# Patient Record
Sex: Male | Born: 2000 | Hispanic: No | Marital: Single | Smoking: Never smoker
Health system: Southern US, Community
[De-identification: ages and names within clinical notes are randomized; demographics above are authoritative.]

---
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
## 2019-02-10 ENCOUNTER — Emergency Department (HOSPITAL_COMMUNITY)
Admission: EM | Admit: 2019-02-10 | Discharge: 2019-02-10 | Disposition: A | Discharge status: Home / Self Care | Payer: Self-pay | Attending: Emergency Medicine | Admitting: Emergency Medicine

## 2019-02-10 ENCOUNTER — Other Ambulatory Visit: Payer: Self-pay

## 2019-02-10 DIAGNOSIS — R1013 Epigastric pain: Secondary | ICD-10-CM | POA: Insufficient documentation

## 2019-02-10 DIAGNOSIS — R112 Nausea with vomiting, unspecified: Secondary | ICD-10-CM | POA: Insufficient documentation

## 2019-02-10 DIAGNOSIS — R6883 Chills (without fever): Secondary | ICD-10-CM | POA: Insufficient documentation

## 2019-02-10 LAB — URINALYSIS, ROUTINE W REFLEX MICROSCOPIC
Bilirubin Urine: NEGATIVE
Glucose, UA: NEGATIVE mg/dL
Hgb urine dipstick: NEGATIVE
Ketones, ur: NEGATIVE mg/dL
Leukocytes,Ua: NEGATIVE
Nitrite: NEGATIVE
Protein, ur: NEGATIVE mg/dL
Specific Gravity, Urine: 1.029 (ref 1.005–1.030)
pH: 5 (ref 5.0–8.0)

## 2019-02-10 LAB — COMPREHENSIVE METABOLIC PANEL
ALT: 44 U/L (ref 0–44)
AST: 67 U/L — ABNORMAL HIGH (ref 15–41)
Albumin: 4.7 g/dL (ref 3.5–5.0)
Alkaline Phosphatase: 46 U/L (ref 38–126)
Anion gap: 11 (ref 5–15)
BUN: 14 mg/dL (ref 6–20)
CO2: 26 mmol/L (ref 22–32)
Calcium: 9.3 mg/dL (ref 8.9–10.3)
Chloride: 102 mmol/L (ref 98–111)
Creatinine, Ser: 1.07 mg/dL (ref 0.61–1.24)
GFR calc Af Amer: >60 mL/min (ref >60)
GFR calc non Af Amer: >60 mL/min (ref >60)
Glucose, Bld: 130 mg/dL — ABNORMAL HIGH (ref 70–99)
Potassium: 3.9 mmol/L (ref 3.5–5.1)
Sodium: 139 mmol/L (ref 135–145)
Total Bilirubin: 1.4 mg/dL — ABNORMAL HIGH (ref 0.3–1.2)
Total Protein: 7.3 g/dL (ref 6.5–8.1)

## 2019-02-10 LAB — CBC
HCT: 47.9 % (ref 39.0–52.0)
Hemoglobin: 15.9 g/dL (ref 13.0–17.0)
MCH: 27.3 pg (ref 26.0–34.0)
MCHC: 33.2 g/dL (ref 30.0–36.0)
MCV: 82.3 fL (ref 80.0–100.0)
Platelets: 185 10*3/uL (ref 150–400)
RBC: 5.82 MIL/uL — ABNORMAL HIGH (ref 4.22–5.81)
RDW: 13.2 % (ref 11.5–15.5)
WBC: 16.7 10*3/uL — ABNORMAL HIGH (ref 4.0–10.5)
nRBC: 0 % (ref 0.0–0.2)

## 2019-02-10 LAB — LIPASE, BLOOD: Lipase: 20 U/L (ref 11–51)

## 2019-02-10 MED ORDER — ONDANSETRON 4 MG PO TBDP
4.0000 mg | ORAL_TABLET | Freq: Once | ORAL | Status: AC
  Administered 2019-02-10: 4 mg via ORAL
  Filled 2019-02-10: qty 1

## 2019-02-10 MED ORDER — SODIUM CHLORIDE 0.9 % IV BOLUS (SEPSIS)
1000.0000 mL | Freq: Once | INTRAVENOUS | Status: AC
  Administered 2019-02-10: 1000 mL via INTRAVENOUS

## 2019-02-10 MED ORDER — DICYCLOMINE HCL 20 MG PO TABS: 20.0000 mg | ORAL_TABLET | Freq: Three times a day (TID) | ORAL | 0 refills | Status: AC | PRN

## 2019-02-10 MED ORDER — DICYCLOMINE HCL 10 MG/ML IM SOLN
20.0000 mg | Freq: Once | INTRAMUSCULAR | Status: AC
  Administered 2019-02-10: 20 mg via INTRAMUSCULAR
  Filled 2019-02-10: qty 2

## 2019-02-10 MED ORDER — ONDANSETRON HCL 4 MG/2ML IJ SOLN
4.0000 mg | Freq: Once | INTRAMUSCULAR | Status: AC
  Administered 2019-02-10: 06:00:00 4 mg via INTRAVENOUS
  Filled 2019-02-10: qty 2

## 2019-02-10 MED ORDER — ONDANSETRON 4 MG PO TBDP: 4.0000 mg | ORAL_TABLET | Freq: Four times a day (QID) | ORAL | 0 refills | Status: AC | PRN

## 2019-02-10 NOTE — ED Triage Notes (Signed)
Pt c/o abd pain, n/v that began last night.

## 2019-02-10 NOTE — ED Provider Notes (Signed)
TIME SEEN: 4:01 AM  CHIEF COMPLAINT: Epigastric abdominal pain, nausea and vomiting  HPI: Patient is a 19 year old male who presents to the emergency department with epigastric abdominal pain, nausea and vomiting that started last night at 9 PM.  No diarrhea.  Has had chills but no fever.  No sick contacts or recent travel.  No dysuria, hematuria, penile discharge.  No previous abdominal surgery.  Patient reports he does smoke marijuana regularly.  Did smoke marijuana yesterday.   ROS: See HPI Constitutional: no fever  Eyes: no drainage  ENT: no runny nose   Cardiovascular:  no chest pain  Resp: no SOB  GI:  vomiting GU: no dysuria Integumentary: no rash  Allergy: no hives  Musculoskeletal: no leg swelling  Neurological: no slurred speech ROS otherwise negative  PAST MEDICAL HISTORY/PAST SURGICAL HISTORY:  No past medical history on file.  MEDICATIONS:  Prior to Admission medications   Medication Sig Start Date End Date Taking? Authorizing Provider  ketoconazole (NIZORAL) 2 % cream Apply 1 application topically 2 (two) times daily. FOR 4 WEEKS 11/30/12   Dillon Bjork, MD    ALLERGIES:  No Known Allergies  SOCIAL HISTORY:  Social History   Tobacco Use  . Smoking status: Never Smoker  Substance Use Topics  . Alcohol use: Not on file    FAMILY HISTORY: No family history on file.  EXAM: BP (!) 128/58   Pulse 98   Temp 97.8 F (36.6 C) (Oral)   Resp 16   Ht 5\' 5"  (1.651 m)   Wt 54.4 kg   SpO2 98%   BMI 19.97 kg/m  CONSTITUTIONAL: Alert and oriented and responds appropriately to questions. Well-appearing; well-nourished HEAD: Normocephalic EYES: Conjunctivae clear, pupils appear equal, EOM appear intact ENT: normal nose; moist mucous membranes NECK: Supple, normal ROM CARD: RRR; S1 and S2 appreciated; no murmurs, no clicks, no rubs, no gallops RESP: Normal chest excursion without splinting or tachypnea; breath sounds clear and equal bilaterally; no wheezes, no  rhonchi, no rales, no hypoxia or respiratory distress, speaking full sentences ABD/GI: Normal bowel sounds; non-distended; soft, non-tender, no rebound, no guarding, no peritoneal signs, no hepatosplenomegaly, no tenderness at McBurney's point, negative Murphy sign BACK:  The back appears normal EXT: Normal ROM in all joints; no deformity noted, no edema; no cyanosis SKIN: Normal color for age and race; warm; no rash on exposed skin NEURO: Moves all extremities equally PSYCH: The patient's mood and manner are appropriate.   MEDICAL DECISION MAKING: Patient here with abdominal pain, vomiting.  His abdominal exam is benign.  Suspect this could be viral illness versus cannabinoid hyperemesis syndrome.  Low suspicion for cholecystitis, pancreatitis, appendicitis, colitis, diverticulitis at this time.  Labs today show leukocytosis of 16,000.  Otherwise unremarkable.  Will give IV fluids, Zofran, Bentyl and reassess.  ED PROGRESS: 7:00 AM  Pt's repeat abdominal exam is benign.  He states he is feeling better.  Provided with water and saltine crackers.  Will reassess after p.o. challenge.  If tolerates p.o. and is still doing well, will discharge home.   I reviewed all nursing notes and pertinent previous records as available.  I have interpreted any EKGs, lab and urine results, imaging (as available).       Jeffery Bachmeier was evaluated in Emergency Department on 02/10/2019 for the symptoms described in the history of present illness. He was evaluated in the context of the global COVID-19 pandemic, which necessitated consideration that the patient might be at risk for infection with  the SARS-CoV-2 virus that causes COVID-19. Institutional protocols and algorithms that pertain to the evaluation of patients at risk for COVID-19 are in a state of rapid change based on information released by regulatory bodies including the CDC and federal and state organizations. These policies and algorithms were  followed during the patient's care in the ED.  Patient was seen wearing N95, face shield, gloves.    Elaynah Virginia, Layla Maw, DO 02/10/19 [phone]

## 2019-02-10 NOTE — Discharge Instructions (Addendum)
You may alternate Tylenol 1000 mg every 6 hours as needed for pain, fever and Ibuprofen 800 mg every 8 hours as needed for pain, fever.  Please take Ibuprofen with food.  Do not take more than 4000 mg of Tylenol (acetaminophen) in a 24 hour period.    Steps to find a Primary Care Provider (PCP):  Call [phone] or [phone] to access "Portal Find a Doctor Service."  2.  You may also go on the [facility] website at [email or web address]  3.  [facility] also frequently accepts new patients.  [facility]  [address] [place] [address] [phone]  4.  There are also multiple [facility], [facility] and [facility] practices throughout [place] that are frequently accepting new patients. You may find a clinic that is close to your home and contact them.  [email or web address] [phone]  [facility] [email or web address]  [email or web address] [phone]  [email or web address] [phone]  5.  Local Health Departments also can provide primary care services.  [facility]  [address] [place] [address] [phone]  [facility] [address] [place] [address] [phone]  [facility] [address]  [place]  [address] [phone]
